# Patient Record
Sex: Female | Born: 1955 | Race: Black or African American | Hispanic: No | Marital: Married | Smoking: Never smoker
Health system: Southern US, Community
[De-identification: ages and names within clinical notes are randomized; demographics above are authoritative.]

## PROBLEM LIST (undated history)

## (undated) DIAGNOSIS — I10 Essential (primary) hypertension: Secondary | ICD-10-CM

## (undated) DIAGNOSIS — E785 Hyperlipidemia, unspecified: Secondary | ICD-10-CM

## (undated) HISTORY — PX: TUBAL LIGATION: SHX77

## (undated) HISTORY — DX: Hyperlipidemia, unspecified: E78.5

---

## 2007-10-21 HISTORY — PX: COLONOSCOPY: SHX174

## 2010-12-14 ENCOUNTER — Ambulatory Visit: Payer: Self-pay | Admitting: Internal Medicine

## 2012-02-16 ENCOUNTER — Ambulatory Visit: Payer: Self-pay | Admitting: Internal Medicine

## 2013-03-06 ENCOUNTER — Ambulatory Visit: Payer: Self-pay | Admitting: Internal Medicine

## 2014-03-17 ENCOUNTER — Ambulatory Visit: Payer: Self-pay | Admitting: Internal Medicine

## 2015-03-07 ENCOUNTER — Encounter: Payer: Self-pay | Admitting: Internal Medicine

## 2015-03-07 DIAGNOSIS — M19079 Primary osteoarthritis, unspecified ankle and foot: Secondary | ICD-10-CM | POA: Insufficient documentation

## 2015-03-07 DIAGNOSIS — E785 Hyperlipidemia, unspecified: Secondary | ICD-10-CM | POA: Insufficient documentation

## 2015-03-07 DIAGNOSIS — R600 Localized edema: Secondary | ICD-10-CM | POA: Insufficient documentation

## 2015-04-21 ENCOUNTER — Ambulatory Visit (INDEPENDENT_AMBULATORY_CARE_PROVIDER_SITE_OTHER): Payer: Managed Care, Other (non HMO) | Admitting: Internal Medicine

## 2015-04-21 ENCOUNTER — Encounter: Payer: Self-pay | Admitting: Internal Medicine

## 2015-04-21 ENCOUNTER — Ambulatory Visit
Admission: RE | Admit: 2015-04-21 | Discharge: 2015-04-21 | Disposition: A | Discharge status: Home / Self Care | Payer: Managed Care, Other (non HMO) | Source: Ambulatory Visit | Attending: Internal Medicine | Admitting: Internal Medicine

## 2015-04-21 VITALS — BP 130/88 | HR 64 | Ht 65.0 in | Wt 196.0 lb

## 2015-04-21 DIAGNOSIS — Z114 Encounter for screening for human immunodeficiency virus [HIV]: Secondary | ICD-10-CM

## 2015-04-21 DIAGNOSIS — R600 Localized edema: Secondary | ICD-10-CM | POA: Diagnosis not present

## 2015-04-21 DIAGNOSIS — Z1239 Encounter for other screening for malignant neoplasm of breast: Secondary | ICD-10-CM | POA: Diagnosis not present

## 2015-04-21 DIAGNOSIS — E785 Hyperlipidemia, unspecified: Secondary | ICD-10-CM | POA: Diagnosis not present

## 2015-04-21 DIAGNOSIS — R030 Elevated blood-pressure reading, without diagnosis of hypertension: Secondary | ICD-10-CM | POA: Diagnosis not present

## 2015-04-21 DIAGNOSIS — Z0000 Encounter for general adult medical examination without abnormal findings: Secondary | ICD-10-CM | POA: Diagnosis not present

## 2015-04-21 DIAGNOSIS — Z1231 Encounter for screening mammogram for malignant neoplasm of breast: Secondary | ICD-10-CM | POA: Insufficient documentation

## 2015-04-21 DIAGNOSIS — Z23 Encounter for immunization: Secondary | ICD-10-CM

## 2015-04-21 DIAGNOSIS — Z1159 Encounter for screening for other viral diseases: Secondary | ICD-10-CM | POA: Diagnosis not present

## 2015-04-21 LAB — POCT URINALYSIS DIPSTICK
Bilirubin, UA: NEGATIVE
Blood, UA: NEGATIVE
Glucose, UA: NEGATIVE
Ketones, UA: NEGATIVE
Leukocytes, UA: NEGATIVE
Nitrite, UA: NEGATIVE
Protein, UA: NEGATIVE
Spec Grav, UA: 1.01
Urobilinogen, UA: 0.2
pH, UA: 5

## 2015-04-21 NOTE — Patient Instructions (Addendum)
DASH Eating Plan DASH stands for "Dietary Approaches to Stop Hypertension." The DASH eating plan is a healthy eating plan that has been shown to reduce high blood pressure (hypertension). Additional health benefits may include reducing the risk of type 2 diabetes mellitus, heart disease, and stroke. The DASH eating plan may also help with weight loss. WHAT DO I NEED TO KNOW ABOUT THE DASH EATING PLAN? For the DASH eating plan, you will follow these general guidelines:  Choose foods with a percent daily value for sodium of less than 5% (as listed on the food label).  Use salt-free seasonings or herbs instead of table salt or sea salt.  Check with your health care provider or pharmacist before using salt substitutes.  Eat lower-sodium products, often labeled as "lower sodium" or "no salt added."  Eat fresh foods.  Eat more vegetables, fruits, and low-fat dairy products.  Choose whole grains. Look for the word "whole" as the first word in the ingredient list.  Choose fish and skinless chicken or [place] more often than red meat. Limit fish, poultry, and meat to 6 oz (170 g) each day.  Limit sweets, desserts, sugars, and sugary drinks.  Choose heart-healthy fats.  Limit cheese to 1 oz (28 g) per day.  Eat more home-cooked food and less restaurant, buffet, and fast food.  Limit fried foods.  Cook foods using methods other than frying.  Limit canned vegetables. If you do use them, rinse them well to decrease the sodium.  When eating at a restaurant, ask that your food be prepared with less salt, or no salt if possible. WHAT FOODS CAN I EAT? Seek help from a dietitian for individual calorie needs. Grains Whole grain or whole wheat bread. Brown rice. Whole grain or whole wheat pasta. Quinoa, bulgur, and whole grain cereals. Low-sodium cereals. Corn or whole wheat flour tortillas. Whole grain cornbread. Whole grain crackers. Low-sodium crackers. Vegetables Fresh or frozen vegetables  (raw, steamed, roasted, or grilled). Low-sodium or reduced-sodium tomato and vegetable juices. Low-sodium or reduced-sodium tomato sauce and paste. Low-sodium or reduced-sodium canned vegetables.  Fruits All fresh, canned (in natural juice), or frozen fruits. Meat and Other Protein Products Ground beef (85% or leaner), grass-fed beef, or beef trimmed of fat. Skinless chicken or [place]. Ground chicken or [place]. Pork trimmed of fat. All fish and seafood. Eggs. Dried beans, peas, or lentils. Unsalted nuts and seeds. Unsalted canned beans. Dairy Low-fat dairy products, such as skim or 1% milk, 2% or reduced-fat cheeses, low-fat ricotta or cottage cheese, or plain low-fat yogurt. Low-sodium or reduced-sodium cheeses. Fats and Oils Tub margarines without trans fats. Light or reduced-fat mayonnaise and salad dressings (reduced sodium). Avocado. Safflower, olive, or canola oils. Natural peanut or almond butter. Other Unsalted popcorn and pretzels. The items listed above may not be a complete list of recommended foods or beverages. Contact your dietitian for more options. WHAT FOODS ARE NOT RECOMMENDED? Grains White bread. White pasta. White rice. Refined cornbread. Bagels and croissants. Crackers that contain trans fat. Vegetables Creamed or fried vegetables. Vegetables in a cheese sauce. Regular canned vegetables. Regular canned tomato sauce and paste. Regular tomato and vegetable juices. Fruits Dried fruits. Canned fruit in light or heavy syrup. Fruit juice. Meat and Other Protein Products Fatty cuts of meat. Ribs, chicken wings, bacon, sausage, bologna, salami, chitterlings, fatback, hot dogs, bratwurst, and packaged luncheon meats. Salted nuts and seeds. Canned beans with salt. Dairy Whole or 2% milk, cream, half-and-half, and cream cheese. Whole-fat or sweetened yogurt. Full-fat  cheeses or blue cheese. Nondairy creamers and whipped toppings. Processed cheese, cheese spreads, or cheese  curds. Condiments Onion and garlic salt, seasoned salt, table salt, and sea salt. Canned and packaged gravies. Worcestershire sauce. Tartar sauce. Barbecue sauce. Teriyaki sauce. Soy sauce, including reduced sodium. Steak sauce. Fish sauce. Oyster sauce. Cocktail sauce. Horseradish. Ketchup and mustard. Meat flavorings and tenderizers. Bouillon cubes. Hot sauce. Tabasco sauce. Marinades. Taco seasonings. Relishes. Fats and Oils Butter, stick margarine, lard, shortening, ghee, and bacon fat. Coconut, palm kernel, or palm oils. Regular salad dressings. Other Pickles and olives. Salted popcorn and pretzels. The items listed above may not be a complete list of foods and beverages to avoid. Contact your dietitian for more information. WHERE CAN I FIND MORE INFORMATION? National Heart, Lung, and Blood Institute: CableProm[employer].itwww.nhlbi.nih.gov/health/health-topics/topics/dash/   This information is not intended to replace advice given to you by your health care provider. Make sure you discuss any questions you have with your health care provider.   Document Released: 04/27/2011 Document Revised: 05/29/2014 Document Reviewed: 03/12/2013 Elsevier Interactive Patient Education 2016 Elsevier Inc.      Breast Self-Awareness Practicing breast self-awareness may pick up problems early, prevent significant medical complications, and possibly save your life. By practicing breast self-awareness, you can become familiar with how your breasts look and feel and if your breasts are changing. This allows you to notice changes early. It can also offer you some reassurance that your breast health is good. One way to learn what is normal for your breasts and whether your breasts are changing is to do a breast self-exam. If you find a lump or something that was not present in the past, it is best to contact your caregiver right away. Other findings that should be evaluated by your caregiver include nipple discharge, especially if  it is bloody; skin changes or reddening; areas where the skin seems to be pulled in (retracted); or new lumps and bumps. Breast pain is seldom associated with cancer (malignancy), but should also be evaluated by a caregiver. HOW TO PERFORM A BREAST SELF-EXAM The best time to examine your breasts is 5-7 days after your menstrual period is over. During menstruation, the breasts are lumpier, and it may be more difficult to pick up changes. If you do not menstruate, have reached menopause, or had your uterus removed (hysterectomy), you should examine your breasts at regular intervals, such as monthly. If you are breastfeeding, examine your breasts after a feeding or after using a breast pump. Breast implants do not decrease the risk for lumps or tumors, so continue to perform breast self-exams as recommended. Talk to your caregiver about how to determine the difference between the implant and breast tissue. Also, talk about the amount of pressure you should use during the exam. Over time, you will become more familiar with the variations of your breasts and more comfortable with the exam. A breast self-exam requires you to remove all your clothes above the waist.  Look at your breasts and nipples. Stand in front of a mirror in a room with good lighting. With your hands on your hips, push your hands firmly downward. Look for a difference in shape, contour, and size from one breast to the other (asymmetry). Asymmetry includes puckers, dips, or bumps. Also, look for skin changes, such as reddened or scaly areas on the breasts. Look for nipple changes, such as discharge, dimpling, repositioning, or redness.  Carefully feel your breasts. This is best done either in the shower or tub while  using soapy water or when flat on your back. Place the arm (on the side of the breast you are examining) above your head. Use the pads (not the fingertips) of your three middle fingers on your opposite hand to feel your breasts.  Start in the underarm area and use  inch (2 cm) overlapping circles to feel your breast. Use 3 different levels of pressure (light, medium, and firm pressure) at each circle before moving to the next circle. The light pressure is needed to feel the tissue closest to the skin. The medium pressure will help to feel breast tissue a little deeper, while the firm pressure is needed to feel the tissue close to the ribs. Continue the overlapping circles, moving downward over the breast until you feel your ribs below your breast. Then, move one finger-width towards the center of the body. Continue to use the  inch (2 cm) overlapping circles to feel your breast as you move slowly up toward the collar bone (clavicle) near the base of the neck. Continue the up and down exam using all 3 pressures until you reach the middle of the chest. Do this with each breast, carefully feeling for lumps or changes.   Keep a written record with breast changes or normal findings for each breast. By writing this information down, you do not need to depend only on memory for size, tenderness, or location. Write down where you are in your menstrual cycle, if you are still menstruating. Breast tissue can have some lumps or thick tissue. However, see your caregiver if you find anything that concerns you.  SEEK MEDICAL CARE IF:  You see a change in shape, contour, or size of your breasts or nipples.   You see skin changes, such as reddened or scaly areas on the breasts or nipples.   You have an unusual discharge from your nipples.   You feel a new lump or unusually thick areas.    This information is not intended to replace advice given to you by your health care provider. Make sure you discuss any questions you have with your health care provider.   Document Released: 05/08/2005 Document Revised: 04/24/2012 Document Reviewed: 08/23/2011 Elsevier Interactive Patient Education [employer]

## 2015-04-21 NOTE — Progress Notes (Signed)
Date:  04/21/2015   Name:  Feliz BeamCathy Alston Laverdiere   DOB:  10/06/1955   MRN:  [id number]   Chief Complaint: Annual Exam Lynden Angathy Scot Docklston Mantel is a 59 y.o. female who presents today for her Complete Annual Exam. She feels well. She reports exercising none but plans to start soon. She reports she is sleeping well. She denies breast problems but is due for a mammogram.  Her last pap smear was 2014. She would like to get a flu shot today.  Review of Systems  Constitutional: Negative for fever, chills and fatigue.  HENT: Negative for ear pain, hearing loss, sinus pressure and sore throat.   Eyes: Negative for visual disturbance.  Respiratory: Negative for choking, shortness of breath and wheezing.   Cardiovascular: Negative for chest pain, palpitations and leg swelling.  Gastrointestinal: Negative for vomiting, abdominal pain, diarrhea and constipation.  Genitourinary: Negative for dysuria, urgency, hematuria, vaginal bleeding and vaginal discharge.  Musculoskeletal: Negative for back pain, joint swelling and arthralgias.  Skin: Negative for color change and rash.  Neurological: Negative for dizziness, tremors, light-headedness and headaches.  Hematological: Negative for adenopathy.  Psychiatric/Behavioral: Negative for sleep disturbance and dysphoric mood.    Patient Active Problem List   Diagnosis Date Noted  . Hyperlipidemia 03/07/2015  . Ankle inflammation 03/07/2015  . Local edema 03/07/2015    Prior to Admission medications   Not on File    No Known Allergies  Past Surgical History  Procedure Laterality Date  . Tubal ligation    . Colonoscopy      normal    Social History  Substance Use Topics  . Smoking status: Never Smoker   . Smokeless tobacco: None  . Alcohol Use: 1.2 oz/week    2 Standard drinks or equivalent per week    Medication list has been reviewed and updated.   Physical Exam  Constitutional: She is oriented to person, place, and time. She appears  well-developed and well-nourished. No distress.  HENT:  Head: Normocephalic and atraumatic.  Right Ear: Tympanic membrane and ear canal normal.  Left Ear: Tympanic membrane and ear canal normal.  Nose: Right sinus exhibits no maxillary sinus tenderness. Left sinus exhibits no maxillary sinus tenderness.  Mouth/Throat: Uvula is midline and oropharynx is clear and moist.  Eyes: Conjunctivae and EOM are normal. Right eye exhibits no discharge. Left eye exhibits no discharge. No scleral icterus.  Neck: Normal range of motion. Carotid bruit is not present. No erythema present. No thyromegaly present.  Cardiovascular: Normal rate, regular rhythm, normal heart sounds and normal pulses.   Pulmonary/Chest: Effort normal. No respiratory distress. She has no wheezes. Right breast exhibits no mass, no nipple discharge, no skin change and no tenderness. Left breast exhibits no mass, no nipple discharge, no skin change and no tenderness.  Abdominal: Soft. Bowel sounds are normal. There is no hepatosplenomegaly. There is no tenderness. There is no CVA tenderness.  Musculoskeletal: Normal range of motion.  Lymphadenopathy:    She has no cervical adenopathy.    She has no axillary adenopathy.  Neurological: She is alert and oriented to person, place, and time. She has normal reflexes. No cranial nerve deficit or sensory deficit.  Skin: Skin is warm, dry and intact. No rash noted.  Psychiatric: She has a normal mood and affect. Her speech is normal and behavior is normal. Thought content normal.  Nursing note and vitals reviewed.   BP 130/88 mmHg  Pulse 64  Ht 5\' 5"  (1.651 m)  Wt 196  lb (88.905 kg)  BMI 32.62 kg/m2  Assessment and Plan: 1. Annual physical exam Pap and colonoscopy are up to date. - POCT urinalysis dipstick  2. Hyperlipidemia Advise on medication when labs return - Lipid panel  3. Local edema Controlled - continue to limit sodium - CBC with Differential/Platelet - Comprehensive  metabolic panel - TSH  4. Breast cancer screening - MM DIGITAL SCREENING BILATERAL; Future  5. Need for hepatitis C screening test - Hepatitis C antibody  6. Encounter for screening for HIV - HIV antibody  7. Need for influenza vaccination - Flu Vaccine QUAD 36+ mos IM  8. Elevated blood pressure reading DASH diet information given Patient will monitor blood pressures through work and return if persistently elevated   Bari Edward, MD [facility]  04/21/2015

## 2015-04-22 LAB — CBC WITH DIFFERENTIAL/PLATELET
Basophils Absolute: 0 10*3/uL (ref 0.0–0.2)
Basos: 0 %
EOS (ABSOLUTE): 0.1 10*3/uL (ref 0.0–0.4)
Eos: 2 %
Hematocrit: 40.4 % (ref 34.0–46.6)
Hemoglobin: 13 g/dL (ref 11.1–15.9)
Immature Grans (Abs): 0 10*3/uL (ref 0.0–0.1)
Immature Granulocytes: 0 %
Lymphocytes Absolute: 2.4 10*3/uL (ref 0.7–3.1)
Lymphs: 51 %
MCH: 27.3 pg (ref 26.6–33.0)
MCHC: 32.2 g/dL (ref 31.5–35.7)
MCV: 85 fL (ref 79–97)
Monocytes Absolute: 0.3 10*3/uL (ref 0.1–0.9)
Monocytes: 6 %
Neutrophils Absolute: 1.9 10*3/uL (ref 1.4–7.0)
Neutrophils: 41 %
Platelets: 270 10*3/uL (ref 150–379)
RBC: 4.77 x10E6/uL (ref 3.77–5.28)
RDW: 14.3 % (ref 12.3–15.4)
WBC: 4.8 10*3/uL (ref 3.4–10.8)

## 2015-04-22 LAB — LIPID PANEL
Chol/HDL Ratio: 4.1 ratio units (ref 0.0–4.4)
Cholesterol, Total: 233 mg/dL — ABNORMAL HIGH (ref 100–199)
HDL: 57 mg/dL (ref >39)
LDL Calculated: 156 mg/dL — ABNORMAL HIGH (ref 0–99)
Triglycerides: 100 mg/dL (ref 0–149)
VLDL Cholesterol Cal: 20 mg/dL (ref 5–40)

## 2015-04-22 LAB — COMPREHENSIVE METABOLIC PANEL
ALT: 13 IU/L (ref 0–32)
AST: 15 IU/L (ref 0–40)
Albumin/Globulin Ratio: 1.5 (ref 1.1–2.5)
Albumin: 4.1 g/dL (ref 3.5–5.5)
Alkaline Phosphatase: 84 IU/L (ref 39–117)
BUN/Creatinine Ratio: 15 (ref 9–23)
BUN: 12 mg/dL (ref 6–24)
Bilirubin Total: 0.3 mg/dL (ref 0.0–1.2)
CO2: 28 mmol/L (ref 18–29)
Calcium: 9.3 mg/dL (ref 8.7–10.2)
Chloride: 102 mmol/L (ref 97–106)
Creatinine, Ser: 0.79 mg/dL (ref 0.57–1.00)
GFR calc Af Amer: 95 mL/min/{1.73_m2} (ref >59)
GFR calc non Af Amer: 82 mL/min/{1.73_m2} (ref >59)
Globulin, Total: 2.7 g/dL (ref 1.5–4.5)
Glucose: 80 mg/dL (ref 65–99)
Potassium: 4.3 mmol/L (ref 3.5–5.2)
Sodium: 143 mmol/L (ref 136–144)
Total Protein: 6.8 g/dL (ref 6.0–8.5)

## 2015-04-22 LAB — HIV ANTIBODY (ROUTINE TESTING W REFLEX): HIV Screen 4th Generation wRfx: NONREACTIVE

## 2015-04-22 LAB — TSH: TSH: 2 u[IU]/mL (ref 0.450–4.500)

## 2015-04-22 LAB — HEPATITIS C ANTIBODY: Hep C Virus Ab: 0.1 s/co ratio (ref 0.0–0.9)

## 2016-03-13 ENCOUNTER — Other Ambulatory Visit: Payer: Self-pay | Admitting: Internal Medicine

## 2016-03-13 DIAGNOSIS — Z1231 Encounter for screening mammogram for malignant neoplasm of breast: Secondary | ICD-10-CM

## 2016-04-24 ENCOUNTER — Ambulatory Visit
Admission: RE | Admit: 2016-04-24 | Discharge: 2016-04-24 | Disposition: A | Discharge status: Home / Self Care | Payer: Managed Care, Other (non HMO) | Source: Ambulatory Visit | Attending: Internal Medicine | Admitting: Internal Medicine

## 2016-04-24 DIAGNOSIS — Z1231 Encounter for screening mammogram for malignant neoplasm of breast: Secondary | ICD-10-CM | POA: Diagnosis not present

## 2016-04-28 ENCOUNTER — Encounter: Payer: Self-pay | Admitting: Internal Medicine

## 2016-04-28 ENCOUNTER — Ambulatory Visit (INDEPENDENT_AMBULATORY_CARE_PROVIDER_SITE_OTHER): Payer: Managed Care, Other (non HMO) | Admitting: Internal Medicine

## 2016-04-28 VITALS — BP 138/80 | HR 78 | Resp 16 | Ht 65.0 in | Wt 191.3 lb

## 2016-04-28 DIAGNOSIS — Z0000 Encounter for general adult medical examination without abnormal findings: Secondary | ICD-10-CM | POA: Diagnosis not present

## 2016-04-28 DIAGNOSIS — R600 Localized edema: Secondary | ICD-10-CM

## 2016-04-28 DIAGNOSIS — Z23 Encounter for immunization: Secondary | ICD-10-CM | POA: Insufficient documentation

## 2016-04-28 DIAGNOSIS — E782 Mixed hyperlipidemia: Secondary | ICD-10-CM | POA: Diagnosis not present

## 2016-04-28 LAB — POCT URINALYSIS DIPSTICK
Bilirubin, UA: NEGATIVE
Blood, UA: NEGATIVE
Glucose, UA: NEGATIVE
Ketones, UA: NEGATIVE
Leukocytes, UA: NEGATIVE
Nitrite, UA: NEGATIVE
Protein, UA: NEGATIVE
Spec Grav, UA: >=1.03
Urobilinogen, UA: 0.2
pH, UA: 6

## 2016-04-28 NOTE — Progress Notes (Signed)
Date:  04/28/2016   Name:  Courtney Taylor   DOB:  08/23/1955   MRN:  [id number]   Chief Complaint: Annual Exam (PAP) Courtney Taylor is a 60 y.o. female who presents today for her Complete Annual Exam. She feels well. She reports exercising more regularly. She reports she is sleeping well. She had a mammogram recently that was normal. She is due for a Pap smear.  Last done in 2014. 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  Review of Systems  Constitutional: Negative for chills, fatigue and fever.  HENT: Negative for congestion, hearing loss, tinnitus, trouble swallowing and voice change.   Eyes: Negative for visual disturbance.  Respiratory: Negative for cough, chest tightness, shortness of breath and wheezing.   Cardiovascular: Negative for chest pain, palpitations and leg swelling.  Gastrointestinal: Negative for abdominal pain, constipation, diarrhea and vomiting.  Endocrine: Negative for polydipsia and polyuria.  Genitourinary: Negative for dysuria, frequency, genital sores, vaginal bleeding and vaginal discharge.  Musculoskeletal: Negative for arthralgias, gait problem and joint swelling.  Skin: Negative for color change and rash.  Neurological: Negative for dizziness, tremors, light-headedness and headaches.  Hematological: Negative for adenopathy. Does not bruise/bleed easily.  Psychiatric/Behavioral: Negative for dysphoric mood and sleep disturbance. The patient is not nervous/anxious.     Patient Active Problem List   Diagnosis Date Noted  . Need for diphtheria-tetanus-pertussis (Tdap) vaccine 04/28/2016  . Elevated blood pressure reading 04/21/2015  . Hyperlipidemia 03/07/2015  . Ankle inflammation 03/07/2015  . Local edema 03/07/2015    Prior to Admission medications   Medication Sig Start Date End Date Taking? Authorizing Provider  CINNAMON PO Take by mouth.   Yes Historical Provider, MD  GARLIC PO Take by mouth.   Yes Historical  Provider, MD    No Known Allergies  Past Surgical History:  Procedure Laterality Date  . COLONOSCOPY     normal  . TUBAL LIGATION      Social History  Substance Use Topics  . Smoking status: Never Smoker  . Smokeless tobacco: Never Used  . Alcohol use 1.2 oz/week    2 Standard drinks or equivalent per week   BP Readings from Last 3 Encounters:  04/28/16 138/80  04/21/15 130/88     Medication list has been reviewed and updated.   Physical Exam  Constitutional: She is oriented to person, place, and time. She appears well-developed and well-nourished. No distress.  HENT:  Head: Normocephalic and atraumatic.  Right Ear: Tympanic membrane and ear canal normal.  Left Ear: Tympanic membrane and ear canal normal.  Nose: Right sinus exhibits no maxillary sinus tenderness. Left sinus exhibits no maxillary sinus tenderness.  Mouth/Throat: Uvula is midline and oropharynx is clear and moist.  Eyes: Conjunctivae and EOM are normal. Right eye exhibits no discharge. Left eye exhibits no discharge. No scleral icterus.  Neck: Normal range of motion. Carotid bruit is not present. No erythema present. No thyromegaly present.  Cardiovascular: Normal rate, regular rhythm, normal heart sounds and normal pulses.   Pulmonary/Chest: Effort normal. No respiratory distress. She has no wheezes. Right breast exhibits no mass, no nipple discharge, no skin change and no tenderness. Left breast exhibits no mass, no nipple discharge, no skin change and no tenderness.  Abdominal: Soft. Bowel sounds are normal. There is no hepatosplenomegaly. There is no tenderness. There is no CVA tenderness.  Genitourinary: Vagina normal and uterus normal. There is no tenderness, lesion or injury on the right  labia. There is no tenderness, lesion or injury on the left labia. Cervix exhibits no motion tenderness. Right adnexum displays no mass, no tenderness and no fullness. Left adnexum displays no mass, no tenderness and no  fullness.  Musculoskeletal: Normal range of motion.  Lymphadenopathy:    She has no cervical adenopathy.    She has no axillary adenopathy.  Neurological: She is alert and oriented to person, place, and time. She has normal reflexes. No cranial nerve deficit or sensory deficit.  Skin: Skin is warm, dry and intact. No rash noted.  Psychiatric: She has a normal mood and affect. Her speech is normal and behavior is normal. Thought content normal.  Nursing note and vitals reviewed.   BP 138/80   Pulse 78   Resp 16   Ht 5\' 5"  (1.651 m)   Wt 191 lb 4.8 oz (86.8 kg)   SpO2 99%   BMI 31.83 kg/m   Assessment and Plan: 1. Annual physical exam Continue exercise and annual mammograms - CBC with Differential/Platelet - Pap IG and HPV (high risk) DNA detection - POCT urinalysis dipstick  2. Mixed hyperlipidemia Continue diet - Lipid panel  3. Local edema controlled - Comprehensive metabolic panel - TSH  4. Need for diphtheria-tetanus-pertussis (Tdap) vaccine - Tdap vaccine greater than or equal to 7yo IM   Bari EdwardLaura Elyas Villamor, MD [facility]  04/28/2016

## 2016-04-28 NOTE — Patient Instructions (Signed)
DASH Eating Plan DASH stands for "Dietary Approaches to Stop Hypertension." The DASH eating plan is a healthy eating plan that has been shown to reduce high blood pressure (hypertension). Additional health benefits may include reducing the risk of type 2 diabetes mellitus, heart disease, and stroke. The DASH eating plan may also help with weight loss. What do I need to know about the DASH eating plan? For the DASH eating plan, you will follow these general guidelines:  Choose foods with less than 150 milligrams of sodium per serving (as listed on the food label).  Use salt-free seasonings or herbs instead of table salt or sea salt.  Check with your health care provider or pharmacist before using salt substitutes.  Eat lower-sodium products. These are often labeled as "low-sodium" or "no salt added."  Eat fresh foods. Avoid eating a lot of canned foods.  Eat more vegetables, fruits, and low-fat dairy products.  Choose whole grains. Look for the word "whole" as the first word in the ingredient list.  Choose fish and skinless chicken or turkey more often than red meat. Limit fish, poultry, and meat to 6 oz (170 g) each day.  Limit sweets, desserts, sugars, and sugary drinks.  Choose heart-healthy fats.  Eat more home-cooked food and less restaurant, buffet, and fast food.  Limit fried foods.  Do not fry foods. Cook foods using methods such as baking, boiling, grilling, and broiling instead.  When eating at a restaurant, ask that your food be prepared with less salt, or no salt if possible. What foods can I eat? Seek help from a dietitian for individual calorie needs. Grains  Whole grain or whole wheat bread. Brown rice. Whole grain or whole wheat pasta. Quinoa, bulgur, and whole grain cereals. Low-sodium cereals. Corn or whole wheat flour tortillas. Whole grain cornbread. Whole grain crackers. Low-sodium crackers. Vegetables  Fresh or frozen vegetables (raw, steamed, roasted, or  grilled). Low-sodium or reduced-sodium tomato and vegetable juices. Low-sodium or reduced-sodium tomato sauce and paste. Low-sodium or reduced-sodium canned vegetables. Fruits  All fresh, canned (in natural juice), or frozen fruits. Meat and Other Protein Products  Ground beef (85% or leaner), grass-fed beef, or beef trimmed of fat. Skinless chicken or turkey. Ground chicken or turkey. Pork trimmed of fat. All fish and seafood. Eggs. Dried beans, peas, or lentils. Unsalted nuts and seeds. Unsalted canned beans. Dairy  Low-fat dairy products, such as skim or 1% milk, 2% or reduced-fat cheeses, low-fat ricotta or cottage cheese, or plain low-fat yogurt. Low-sodium or reduced-sodium cheeses. Fats and Oils  Tub margarines without trans fats. Light or reduced-fat mayonnaise and salad dressings (reduced sodium). Avocado. Safflower, olive, or canola oils. Natural peanut or almond butter. Other  Unsalted popcorn and pretzels. The items listed above may not be a complete list of recommended foods or beverages. Contact your dietitian for more options.  What foods are not recommended? Grains  White bread. White pasta. White rice. Refined cornbread. Bagels and croissants. Crackers that contain trans fat. Vegetables  Creamed or fried vegetables. Vegetables in a cheese sauce. Regular canned vegetables. Regular canned tomato sauce and paste. Regular tomato and vegetable juices. Fruits  Canned fruit in light or heavy syrup. Fruit juice. Meat and Other Protein Products  Fatty cuts of meat. Ribs, chicken wings, bacon, sausage, bologna, salami, chitterlings, fatback, hot dogs, bratwurst, and packaged luncheon meats. Salted nuts and seeds. Canned beans with salt. Dairy  Whole or 2% milk, cream, half-and-half, and cream cheese. Whole-fat or sweetened yogurt. Full-fat cheeses   or blue cheese. Nondairy creamers and whipped toppings. Processed cheese, cheese spreads, or cheese curds. Condiments  Onion and garlic  salt, seasoned salt, table salt, and sea salt. Canned and packaged gravies. Worcestershire sauce. Tartar sauce. Barbecue sauce. Teriyaki sauce. Soy sauce, including reduced sodium. Steak sauce. Fish sauce. Oyster sauce. Cocktail sauce. Horseradish. Ketchup and mustard. Meat flavorings and tenderizers. Bouillon cubes. Hot sauce. Tabasco sauce. Marinades. Taco seasonings. Relishes. Fats and Oils  Butter, stick margarine, lard, shortening, ghee, and bacon fat. Coconut, palm kernel, or palm oils. Regular salad dressings. Other  Pickles and olives. Salted popcorn and pretzels. The items listed above may not be a complete list of foods and beverages to avoid. Contact your dietitian for more information.  Where can I find more information? National Heart, Lung, and Blood Institute: www.nhlbi.nih.gov/health/health-topics/topics/dash/ This information is not intended to replace advice given to you by your health care provider. Make sure you discuss any questions you have with your health care provider. Document Released: 04/27/2011 Document Revised: 10/14/2015 Document Reviewed: 03/12/2013 Elsevier Interactive Patient Education  2017 Elsevier Inc.  

## 2016-04-29 LAB — CBC WITH DIFFERENTIAL/PLATELET
Basophils Absolute: 0 10*3/uL (ref 0.0–0.2)
Basos: 0 %
EOS (ABSOLUTE): 0.1 10*3/uL (ref 0.0–0.4)
Eos: 3 %
Hematocrit: 38.4 % (ref 34.0–46.6)
Hemoglobin: 12.3 g/dL (ref 11.1–15.9)
Immature Grans (Abs): 0 10*3/uL (ref 0.0–0.1)
Immature Granulocytes: 0 %
Lymphocytes Absolute: 2.1 10*3/uL (ref 0.7–3.1)
Lymphs: 44 %
MCH: 26.5 pg — ABNORMAL LOW (ref 26.6–33.0)
MCHC: 32 g/dL (ref 31.5–35.7)
MCV: 83 fL (ref 79–97)
Monocytes Absolute: 0.4 10*3/uL (ref 0.1–0.9)
Monocytes: 9 %
Neutrophils Absolute: 2.1 10*3/uL (ref 1.4–7.0)
Neutrophils: 44 %
Platelets: 266 10*3/uL (ref 150–379)
RBC: 4.64 x10E6/uL (ref 3.77–5.28)
RDW: 14.7 % (ref 12.3–15.4)
WBC: 4.8 10*3/uL (ref 3.4–10.8)

## 2016-04-29 LAB — COMPREHENSIVE METABOLIC PANEL
ALT: 25 IU/L (ref 0–32)
AST: 19 IU/L (ref 0–40)
Albumin/Globulin Ratio: 1.5 (ref 1.2–2.2)
Albumin: 4 g/dL (ref 3.6–4.8)
Alkaline Phosphatase: 105 IU/L (ref 39–117)
BUN/Creatinine Ratio: 15 (ref 12–28)
BUN: 14 mg/dL (ref 8–27)
Bilirubin Total: 0.3 mg/dL (ref 0.0–1.2)
CO2: 26 mmol/L (ref 18–29)
Calcium: 9.3 mg/dL (ref 8.7–10.3)
Chloride: 102 mmol/L (ref 96–106)
Creatinine, Ser: 0.93 mg/dL (ref 0.57–1.00)
GFR calc Af Amer: 77 mL/min/{1.73_m2} (ref >59)
GFR calc non Af Amer: 67 mL/min/{1.73_m2} (ref >59)
Globulin, Total: 2.6 g/dL (ref 1.5–4.5)
Glucose: 83 mg/dL (ref 65–99)
Potassium: 4.4 mmol/L (ref 3.5–5.2)
Sodium: 142 mmol/L (ref 134–144)
Total Protein: 6.6 g/dL (ref 6.0–8.5)

## 2016-04-29 LAB — LIPID PANEL
Chol/HDL Ratio: 4.8 ratio units — ABNORMAL HIGH (ref 0.0–4.4)
Cholesterol, Total: 213 mg/dL — ABNORMAL HIGH (ref 100–199)
HDL: 44 mg/dL (ref >39)
LDL Calculated: 148 mg/dL — ABNORMAL HIGH (ref 0–99)
Triglycerides: 105 mg/dL (ref 0–149)
VLDL Cholesterol Cal: 21 mg/dL (ref 5–40)

## 2016-04-29 LAB — TSH: TSH: 1.9 u[IU]/mL (ref 0.450–4.500)

## 2016-05-03 LAB — PAP IG AND HPV HIGH-RISK
HPV, high-risk: POSITIVE — AB
PAP Smear Comment: 0

## 2016-06-13 ENCOUNTER — Encounter: Payer: Self-pay | Admitting: Internal Medicine

## 2016-06-13 ENCOUNTER — Ambulatory Visit (INDEPENDENT_AMBULATORY_CARE_PROVIDER_SITE_OTHER): Payer: Managed Care, Other (non HMO) | Admitting: Internal Medicine

## 2016-06-13 VITALS — BP 126/86 | HR 92 | Temp 98.6°F | Ht 64.5 in | Wt 194.0 lb

## 2016-06-13 DIAGNOSIS — J0100 Acute maxillary sinusitis, unspecified: Secondary | ICD-10-CM | POA: Diagnosis not present

## 2016-06-13 MED ORDER — AMOXICILLIN-POT CLAVULANATE 875-125 MG PO TABS: 1.0000 | ORAL_TABLET | Freq: Two times a day (BID) | ORAL | 0 refills | Status: DC

## 2016-06-13 MED ORDER — GUAIFENESIN-CODEINE 100-10 MG/5ML PO SYRP: 5.0000 mL | ORAL_SOLUTION | Freq: Three times a day (TID) | ORAL | 0 refills | Status: DC | PRN

## 2016-06-13 MED ORDER — FLUTICASONE PROPIONATE 50 MCG/ACT NA SUSP: 2.0000 | Freq: Every day | NASAL | 6 refills | Status: DC

## 2016-06-13 NOTE — Progress Notes (Signed)
Date:  06/13/2016   Name:  Courtney Taylor   DOB:  [birth year]   MRN:  [id number]   Chief Complaint: Cough (pt stated couhing/drainage) Cough  The cough is productive of sputum. Associated symptoms include postnasal drip. Pertinent negatives include no chest pain, chills, ear pain, fever, rash, sore throat, shortness of breath or wheezing. The symptoms are aggravated by exercise. Treatments tried: claritin. The treatment provided no relief.      Review of Systems  Constitutional: Negative for chills, fatigue and fever.  HENT: Positive for congestion, postnasal drip and voice change. Negative for ear pain, sore throat and trouble swallowing.   Respiratory: Positive for cough and chest tightness. Negative for shortness of breath and wheezing.   Cardiovascular: Negative for chest pain, palpitations and leg swelling.  Skin: Negative for color change and rash.  Neurological: Negative for dizziness.    Patient Active Problem List   Diagnosis Date Noted  . Need for diphtheria-tetanus-pertussis (Tdap) vaccine 04/28/2016  . Elevated blood pressure reading 04/21/2015  . Hyperlipidemia 03/07/2015  . Ankle inflammation 03/07/2015  . Local edema 03/07/2015    Prior to Admission medications   Medication Sig Start Date End Date Taking? Authorizing Provider  CINNAMON PO Take by mouth.   Yes Historical Provider, MD  GARLIC PO Take by mouth.   Yes Historical Provider, MD    No Known Allergies  Past Surgical History:  Procedure Laterality Date  . COLONOSCOPY  10/21/2007   normal  . TUBAL LIGATION      Social History  Substance Use Topics  . Smoking status: Never Smoker  . Smokeless tobacco: Never Used  . Alcohol use 1.2 oz/week    2 Standard drinks or equivalent per week     Medication list has been reviewed and updated.   Physical Exam  Constitutional: She is oriented to person, place, and time. She appears well-developed and well-nourished. No distress.  HENT:  Head:  Normocephalic and atraumatic.  Right Ear: External ear and ear canal normal. Tympanic membrane is not erythematous and not retracted.  Left Ear: External ear and ear canal normal. Tympanic membrane is not erythematous and not retracted.  Nose: Right sinus exhibits maxillary sinus tenderness and frontal sinus tenderness. Left sinus exhibits maxillary sinus tenderness and frontal sinus tenderness.  Mouth/Throat: Uvula is midline and mucous membranes are normal. No oral lesions. Posterior oropharyngeal erythema present. No oropharyngeal exudate.  Cardiovascular: Normal rate, regular rhythm and normal heart sounds.   Pulmonary/Chest: Effort normal and breath sounds normal. No respiratory distress. She has no wheezes. She has no rales.  Musculoskeletal: Normal range of motion.  Lymphadenopathy:    She has no cervical adenopathy.  Neurological: She is alert and oriented to person, place, and time.  Skin: Skin is warm and dry. No rash noted.  Psychiatric: She has a normal mood and affect. Her behavior is normal. Thought content normal.  Nursing note and vitals reviewed.   BP 126/86   Pulse 92   Temp 98.6 F (37 C)   Ht 5' 4.5" (1.638 m)   Wt 194 lb (88 kg)   SpO2 95%   BMI 32.79 kg/m   Assessment and Plan: 1. Acute non-recurrent maxillary sinusitis Stop claritin - amoxicillin-clavulanate (AUGMENTIN) 875-125 MG tablet; Take 1 tablet by mouth 2 (two) times daily.  Dispense: 20 tablet; Refill: 0 - guaiFENesin-codeine (ROBITUSSIN AC) 100-10 MG/5ML syrup; Take 5 mLs by mouth 3 (three) times daily as needed for cough.  Dispense: 150 mL; Refill:  0 - fluticasone (FLONASE) 50 MCG/ACT nasal spray; Place 2 sprays into both nostrils daily.  Dispense: 16 g; Refill: 6   Bari EdwardLaura Allexis Bordenave, MD [facility]  06/13/2016

## 2016-06-22 ENCOUNTER — Other Ambulatory Visit: Payer: Self-pay | Admitting: Internal Medicine

## 2016-06-22 DIAGNOSIS — J0100 Acute maxillary sinusitis, unspecified: Secondary | ICD-10-CM

## 2016-06-30 ENCOUNTER — Telehealth: Payer: Self-pay

## 2016-06-30 ENCOUNTER — Other Ambulatory Visit: Payer: Self-pay | Admitting: Internal Medicine

## 2016-06-30 DIAGNOSIS — J0100 Acute maxillary sinusitis, unspecified: Secondary | ICD-10-CM

## 2016-06-30 MED ORDER — AMOXICILLIN-POT CLAVULANATE 875-125 MG PO TABS: 1.0000 | ORAL_TABLET | Freq: Two times a day (BID) | ORAL | 0 refills | Status: DC

## 2016-06-30 NOTE — Telephone Encounter (Signed)
Patient called requesting another round of abx. She reports that she has a productive cough with thick green mucus. Denies fever.  [facility] [place].

## 2016-07-27 ENCOUNTER — Telehealth: Payer: Self-pay

## 2016-07-27 NOTE — Telephone Encounter (Signed)
Pt called stating has sore throat X 2 days. Told to take OTC nasal spray or OTC allergy relief. Pt reports no fever. If not feeling better by Monday, I told her to call back to make appt to be seen.

## 2016-10-31 ENCOUNTER — Ambulatory Visit (INDEPENDENT_AMBULATORY_CARE_PROVIDER_SITE_OTHER): Payer: 59 | Admitting: Internal Medicine

## 2016-10-31 ENCOUNTER — Encounter: Payer: Self-pay | Admitting: Internal Medicine

## 2016-10-31 ENCOUNTER — Ambulatory Visit
Admission: RE | Admit: 2016-10-31 | Discharge: 2016-10-31 | Disposition: A | Discharge status: Home / Self Care | Payer: 59 | Source: Ambulatory Visit | Attending: Internal Medicine | Admitting: Internal Medicine

## 2016-10-31 VITALS — BP 122/74 | HR 95 | Temp 98.5°F | Wt 163.0 lb

## 2016-10-31 DIAGNOSIS — R05 Cough: Secondary | ICD-10-CM

## 2016-10-31 DIAGNOSIS — R059 Cough, unspecified: Secondary | ICD-10-CM

## 2016-10-31 NOTE — Patient Instructions (Addendum)
Advair - One inhalation twice a day.  Rinse mouth after use.

## 2016-10-31 NOTE — Progress Notes (Signed)
Date:  10/31/2016   Name:  Courtney Taylor   DOB:  Sep 16, 1955   MRN:  [id number]   Chief Complaint: Cough Micah Flesher away after visit in January - and then came back a few weeks later. Not getting any better. Clear to yellow production with cough. Throat feels like its draining.  ) Cough  This is a recurrent problem. The current episode started more than 1 month ago. The problem has been unchanged. The cough is productive of sputum. Associated symptoms include postnasal drip. Pertinent negatives include no chest pain, chills, fever, headaches, sore throat, shortness of breath or wheezing. Treatments tried: allegra, robitussin.  No new exposures, pets, work environment.  No benefit to cough from allegra and flonase.    Review of Systems  Constitutional: Negative for chills, fatigue and fever.  HENT: Positive for postnasal drip. Negative for congestion, sinus pressure and sore throat.   Eyes: Negative for visual disturbance.  Respiratory: Positive for cough and chest tightness. Negative for shortness of breath and wheezing.   Cardiovascular: Negative for chest pain and palpitations.  Gastrointestinal: Negative for diarrhea.  Neurological: Negative for dizziness and headaches.    Patient Active Problem List   Diagnosis Date Noted  . Need for diphtheria-tetanus-pertussis (Tdap) vaccine 04/28/2016  . Elevated blood pressure reading 04/21/2015  . Hyperlipidemia 03/07/2015  . Ankle inflammation 03/07/2015  . Local edema 03/07/2015    Prior to Admission medications   Medication Sig Start Date End Date Taking? Authorizing Provider  CINNAMON PO Take by mouth.   Yes [provider]  fluticasone (FLONASE) 50 MCG/ACT nasal spray Place 2 sprays into both nostrils daily. 06/13/16  Yes Reubin Milan, MD  GARLIC PO Take by mouth.   Yes [provider]    No Known Allergies  Past Surgical History:  Procedure Laterality Date  . COLONOSCOPY  10/21/2007   normal  . TUBAL  LIGATION      Social History  Substance Use Topics  . Smoking status: Never Smoker  . Smokeless tobacco: Never Used  . Alcohol use 1.2 oz/week    2 Standard drinks or equivalent per week     Medication list has been reviewed and updated.   Physical Exam  Constitutional: She is oriented to person, place, and time. She appears well-developed. No distress.  HENT:  Head: Normocephalic and atraumatic.  Nose: Right sinus exhibits no maxillary sinus tenderness and no frontal sinus tenderness. Left sinus exhibits no maxillary sinus tenderness and no frontal sinus tenderness.  Mouth/Throat: No posterior oropharyngeal edema or posterior oropharyngeal erythema.  Cardiovascular: Normal rate, regular rhythm and normal heart sounds.   Pulmonary/Chest: Effort normal and breath sounds normal. No respiratory distress. She has no wheezes. She has no rhonchi.  Frequent tight hard cough during visit   Musculoskeletal: Normal range of motion.  Neurological: She is alert and oriented to person, place, and time.  Skin: Skin is warm and dry. No rash noted.  Psychiatric: She has a normal mood and affect. Her behavior is normal. Thought content normal.  Nursing note and vitals reviewed.   BP 122/74   Pulse 95   Temp 98.5 F (36.9 C)   Wt 163 lb (73.9 kg)   SpO2 96%   BMI 27.55 kg/m   Assessment and Plan: 1. Cough Concern for new onset asthma vs other pulmonary process Begin Advair HFA 110  mcg - one inhalation twice a day - DG Chest 2 View; Future   No orders of the  defined types were placed in this encounter.   Bari EdwardLaura Franny Selvage, MD [facility]  10/31/2016

## 2016-11-02 ENCOUNTER — Telehealth: Payer: Self-pay | Admitting: Internal Medicine

## 2016-11-02 NOTE — Telephone Encounter (Signed)
Pt called today and mentioned she missed the call due to being at an appt she also said she had been trying to call back all day, she needs her test/lab results and some medication for her cough before she goes out of town. Please advise. DS

## 2016-11-03 ENCOUNTER — Telehealth: Payer: Self-pay

## 2016-11-03 NOTE — Telephone Encounter (Signed)
Tried calling pt but no answer. Left VM to explain to pt that Thursday has ALWAYS been Dr. Karn CassisBerglund's Half-Day so we have never been here on a Thursday afternoon. Waiting for pt call back.

## 2016-11-03 NOTE — Telephone Encounter (Signed)
Pt called on Thursday afternoon (11/02/2016) explaining she has a cough still, but phlegm is loosening up. Pt left FOUR Voicemail's on my phone. Unfortunately, Thursday is my half-day with Dr. Judithann GravesBerglund so pt seemed upset. Her last VM stated she "cannot believe we left her with this cough. I've called 3 other times and nobody is calling me back." Pt stated she is going out of town and needs a prescription for her cough before so. Per Toney Reilaisy (front desk) pt called again and found out that we are not in office. Pt told Daisy "I need to find me another doctor." I called pt this morning and no answer. Left Vm explaining that Thursday has ALWAYS been our half day. Also explained Dr. Judithann GravesBerglund cannot send anything in for her cough, but we will refer her to a pulmonologist instead. Told pt to callback and let us know if this is what she wants, and we will send referral. Awaiting pt call back with this information.

## 2016-11-06 ENCOUNTER — Other Ambulatory Visit: Payer: Self-pay | Admitting: Internal Medicine

## 2016-11-06 ENCOUNTER — Telehealth: Payer: Self-pay

## 2016-11-06 DIAGNOSIS — R053 Chronic cough: Secondary | ICD-10-CM | POA: Insufficient documentation

## 2016-11-06 DIAGNOSIS — R05 Cough: Secondary | ICD-10-CM

## 2016-11-06 MED ORDER — MONTELUKAST SODIUM 10 MG PO TABS: 10.0000 mg | ORAL_TABLET | Freq: Every day | ORAL | 0 refills | Status: DC

## 2016-11-06 NOTE — Telephone Encounter (Signed)
Rx sent in

## 2016-11-06 NOTE — Telephone Encounter (Signed)
Pt informed of Pulmon[facility] appt [facility]. Pt requesting that Sing[facility]air be sent in [facility] [place] to help with symptoms until Referral appt. Pt states inhaler only helps for a short time.

## 2017-02-05 ENCOUNTER — Telehealth: Payer: Self-pay

## 2017-02-05 NOTE — Telephone Encounter (Signed)
Patient called stating was seen in hospital for Bells Palsy and they are treating her for 7 days. Wanted to know if needs to follow up with Dr Judithann Graves.  Spoke with Dr Judithann Graves- no need to follow up unless symptoms worsen or get no better. Advised patient and she verbally understood.

## 2017-02-09 ENCOUNTER — Ambulatory Visit (INDEPENDENT_AMBULATORY_CARE_PROVIDER_SITE_OTHER): Payer: 59 | Admitting: Internal Medicine

## 2017-02-09 ENCOUNTER — Encounter: Payer: Self-pay | Admitting: Internal Medicine

## 2017-02-09 ENCOUNTER — Telehealth: Payer: Self-pay

## 2017-02-09 VITALS — BP 142/96 | HR 67 | Ht 64.5 in | Wt 152.0 lb

## 2017-02-09 DIAGNOSIS — G510 Bell's palsy: Secondary | ICD-10-CM | POA: Insufficient documentation

## 2017-02-09 MED ORDER — PREDNISONE 10 MG PO TABS: ORAL_TABLET | ORAL | 0 refills | Status: AC

## 2017-02-09 NOTE — Progress Notes (Signed)
Date:  02/09/2017   Name:  Courtney Taylor   DOB:  March 27, 1956   MRN:  [id number]   Chief Complaint: Bell's Palsy (Follow up- Was told by Dr at ER that if it was not better by 7 days, told to see PCP on 7th day for a few more days of both medications.)  Patient was seen in the ER one week ago with left-sided Bell's palsy. She was prescribed Valtrex and prednisone. She's here today because she was told if she wasn't better she should have additional therapy.  She actually is improved, she is able to close her eye completely with effort and she is able to make a seal with her lips. She can blow air into her cheeks with slight leakage. She has mild numbness under her left lower lip. Eyebrow raise on the left is reduced.  Review of Systems  Constitutional: Negative for chills, fatigue and fever.  Respiratory: Positive for cough and choking. Negative for chest tightness and shortness of breath.   Cardiovascular: Negative for chest pain, palpitations and leg swelling.  Neurological: Positive for weakness (of left face).    Patient Active Problem List   Diagnosis Date Noted  . Bell's palsy 02/09/2017  . Chronic cough 11/06/2016  . Need for diphtheria-tetanus-pertussis (Tdap) vaccine 04/28/2016  . Elevated blood pressure reading 04/21/2015  . Hyperlipidemia 03/07/2015  . Ankle inflammation 03/07/2015  . Local edema 03/07/2015    Prior to Admission medications   Not on File    No Known Allergies  Past Surgical History:  Procedure Laterality Date  . COLONOSCOPY  10/21/2007   normal  . TUBAL LIGATION      Social History  Substance Use Topics  . Smoking status: Never Smoker  . Smokeless tobacco: Never Used  . Alcohol use 1.2 oz/week    2 Standard drinks or equivalent per week     Medication list has been reviewed and updated.  PHQ 2/9 Scores 04/28/2016 04/21/2015  PHQ - 2 Score 0 0    Physical Exam  Constitutional: She is oriented to person, place, and time. She  appears well-developed. No distress.  HENT:  Head: Normocephalic and atraumatic.  Neck: Normal range of motion. Neck supple. No thyromegaly present.  Cardiovascular: Normal rate, regular rhythm and normal heart sounds.   Pulmonary/Chest: Effort normal and breath sounds normal. No respiratory distress. She has no wheezes.  Musculoskeletal: Normal range of motion.  Neurological: She is alert and oriented to person, place, and time.  Mild left sided facial weakness - able to close eye completely with effort Able to inflate cheeks but not strongly on left Sensation normal except under left lower lip  Skin: Skin is warm and dry. No rash noted.  Psychiatric: She has a normal mood and affect. Her speech is normal and behavior is normal. Thought content normal.  Nursing note and vitals reviewed.   BP (!) 142/96   Pulse 67   Ht 5' 4.5" (1.638 m)   Wt 152 lb (68.9 kg)   SpO2 99%   BMI 25.69 kg/m   Assessment and Plan: 1. Left-sided Bell's palsy Improving - pt is reassured and educated on the course and expectations Return if sx persist after several more weeks Will give another 5 days of prednisone - predniSONE (DELTASONE) 10 MG tablet; One tab daily  Dispense: 5 tablet; Refill: 0   Meds ordered this encounter  Medications  . predniSONE (DELTASONE) 10 MG tablet    Sig: One tab daily  Dispense:  5 tablet    Refill:  0    Partially dictated using Animal nutritionist. Any errors are unintentional.  Bari Edward, MD [facility]  02/09/2017

## 2017-02-09 NOTE — Telephone Encounter (Signed)
Patient called leaving 2 VMs- Both stating she is calling to follow up about Bells Palsy. States we told her Friday she does not need to be seen unless no better. Patient called today stating she finished medications yesterday and symptoms are not worse but are not going away neither.  States she was on Prednisone 20 mg 4 times daily for 7 days and also Valacyclovir 1 gram 3 times daily for 7 days. Wants to know if she should make OV or can get another round of meds called in?  Please Advise.

## 2017-03-23 ENCOUNTER — Other Ambulatory Visit: Payer: Self-pay | Admitting: Family Medicine

## 2017-03-23 ENCOUNTER — Other Ambulatory Visit: Payer: Self-pay | Admitting: Internal Medicine

## 2017-03-23 DIAGNOSIS — Z1231 Encounter for screening mammogram for malignant neoplasm of breast: Secondary | ICD-10-CM

## 2017-04-25 ENCOUNTER — Other Ambulatory Visit: Payer: Self-pay | Admitting: Family Medicine

## 2017-04-25 ENCOUNTER — Ambulatory Visit
Admission: RE | Admit: 2017-04-25 | Discharge: 2017-04-25 | Disposition: A | Discharge status: Home / Self Care | Payer: 59 | Source: Ambulatory Visit | Attending: Family Medicine | Admitting: Family Medicine

## 2017-04-25 ENCOUNTER — Encounter (INDEPENDENT_AMBULATORY_CARE_PROVIDER_SITE_OTHER): Payer: Self-pay

## 2017-04-25 DIAGNOSIS — Z1231 Encounter for screening mammogram for malignant neoplasm of breast: Secondary | ICD-10-CM | POA: Diagnosis not present

## 2018-03-21 ENCOUNTER — Other Ambulatory Visit: Payer: Self-pay | Admitting: Family Medicine

## 2018-03-21 DIAGNOSIS — Z1231 Encounter for screening mammogram for malignant neoplasm of breast: Secondary | ICD-10-CM

## 2018-04-29 ENCOUNTER — Ambulatory Visit
Admission: RE | Admit: 2018-04-29 | Discharge: 2018-04-29 | Disposition: A | Discharge status: Home / Self Care | Payer: Managed Care, Other (non HMO) | Source: Ambulatory Visit | Attending: Family Medicine | Admitting: Family Medicine

## 2018-04-29 ENCOUNTER — Encounter (INDEPENDENT_AMBULATORY_CARE_PROVIDER_SITE_OTHER): Payer: Self-pay

## 2018-04-29 DIAGNOSIS — Z1231 Encounter for screening mammogram for malignant neoplasm of breast: Secondary | ICD-10-CM | POA: Diagnosis present

## 2019-03-25 ENCOUNTER — Other Ambulatory Visit: Payer: Self-pay | Admitting: Family Medicine

## 2019-03-25 DIAGNOSIS — Z1231 Encounter for screening mammogram for malignant neoplasm of breast: Secondary | ICD-10-CM

## 2019-05-01 ENCOUNTER — Ambulatory Visit
Admission: RE | Admit: 2019-05-01 | Discharge: 2019-05-01 | Disposition: A | Discharge status: Home / Self Care | Payer: Managed Care, Other (non HMO) | Source: Ambulatory Visit | Attending: Family Medicine | Admitting: Family Medicine

## 2019-05-01 ENCOUNTER — Other Ambulatory Visit: Payer: Self-pay

## 2019-05-01 DIAGNOSIS — Z1231 Encounter for screening mammogram for malignant neoplasm of breast: Secondary | ICD-10-CM | POA: Diagnosis not present

## 2020-02-06 ENCOUNTER — Other Ambulatory Visit: Payer: Self-pay

## 2020-02-06 ENCOUNTER — Encounter: Payer: Self-pay | Admitting: Emergency Medicine

## 2020-02-06 ENCOUNTER — Emergency Department
Admission: EM | Admit: 2020-02-06 | Discharge: 2020-02-06 | Disposition: A | Discharge status: Home / Self Care | Payer: Managed Care, Other (non HMO) | Attending: Emergency Medicine | Admitting: Emergency Medicine

## 2020-02-06 DIAGNOSIS — M25561 Pain in right knee: Secondary | ICD-10-CM | POA: Diagnosis not present

## 2020-02-06 DIAGNOSIS — M542 Cervicalgia: Secondary | ICD-10-CM | POA: Insufficient documentation

## 2020-02-06 DIAGNOSIS — M25531 Pain in right wrist: Secondary | ICD-10-CM | POA: Insufficient documentation

## 2020-02-06 DIAGNOSIS — I10 Essential (primary) hypertension: Secondary | ICD-10-CM | POA: Insufficient documentation

## 2020-02-06 DIAGNOSIS — R0781 Pleurodynia: Secondary | ICD-10-CM | POA: Diagnosis not present

## 2020-02-06 DIAGNOSIS — M25562 Pain in left knee: Secondary | ICD-10-CM | POA: Diagnosis not present

## 2020-02-06 DIAGNOSIS — M7918 Myalgia, other site: Secondary | ICD-10-CM | POA: Insufficient documentation

## 2020-02-06 DIAGNOSIS — T148XXA Other injury of unspecified body region, initial encounter: Secondary | ICD-10-CM

## 2020-02-06 DIAGNOSIS — M25532 Pain in left wrist: Secondary | ICD-10-CM | POA: Insufficient documentation

## 2020-02-06 DIAGNOSIS — S161XXA Strain of muscle, fascia and tendon at neck level, initial encounter: Secondary | ICD-10-CM

## 2020-02-06 HISTORY — DX: Essential (primary) hypertension: I10

## 2020-02-06 MED ORDER — KETOROLAC TROMETHAMINE 30 MG/ML IJ SOLN
30.0000 mg | Freq: Once | INTRAMUSCULAR | Status: AC
  Administered 2020-02-06: 30 mg via INTRAMUSCULAR
  Filled 2020-02-06: qty 1

## 2020-02-06 MED ORDER — METHOCARBAMOL 500 MG PO TABS: 500.0000 mg | ORAL_TABLET | Freq: Four times a day (QID) | ORAL | 0 refills | Status: AC

## 2020-02-06 MED ORDER — MELOXICAM 15 MG PO TABS: 15.0000 mg | ORAL_TABLET | Freq: Every day | ORAL | 0 refills | Status: AC
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
## 2020-02-06 NOTE — Discharge Instructions (Signed)
Please take Tylenol every 6 hours as needed for pain.  You may take meloxicam daily for 7 to 10 days with food to help with inflammation.  Use muscle relaxer as needed for muscle tightness and soreness.  Apply ice to sore areas 20 minutes every hour over the next 2 days then you may transition over to heat.  Try to walk and stay active.  Perform light stretching exercises.  Return to the ER for any worsening symptoms or urgent changes in your health.
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
## 2020-03-23 ENCOUNTER — Other Ambulatory Visit: Payer: Self-pay | Admitting: Family Medicine

## 2020-03-23 DIAGNOSIS — Z1231 Encounter for screening mammogram for malignant neoplasm of breast: Secondary | ICD-10-CM

## 2020-05-03 ENCOUNTER — Other Ambulatory Visit: Payer: Self-pay

## 2020-05-03 ENCOUNTER — Ambulatory Visit
Admission: RE | Admit: 2020-05-03 | Discharge: 2020-05-03 | Disposition: A | Discharge status: Home / Self Care | Payer: Managed Care, Other (non HMO) | Source: Ambulatory Visit | Attending: Family Medicine | Admitting: Family Medicine

## 2020-05-03 DIAGNOSIS — Z1231 Encounter for screening mammogram for malignant neoplasm of breast: Secondary | ICD-10-CM | POA: Diagnosis not present

## 2020-05-05 ENCOUNTER — Other Ambulatory Visit: Payer: Self-pay | Admitting: Family Medicine

## 2020-05-26 ENCOUNTER — Other Ambulatory Visit: Payer: Self-pay | Admitting: Family Medicine

## 2020-05-26 DIAGNOSIS — R928 Other abnormal and inconclusive findings on diagnostic imaging of breast: Secondary | ICD-10-CM

## 2020-05-26 DIAGNOSIS — N6320 Unspecified lump in the left breast, unspecified quadrant: Secondary | ICD-10-CM

## 2020-05-27 ENCOUNTER — Other Ambulatory Visit: Payer: Self-pay

## 2020-05-27 ENCOUNTER — Ambulatory Visit
Admission: RE | Admit: 2020-05-27 | Discharge: 2020-05-27 | Disposition: A | Discharge status: Home / Self Care | Payer: Medicare Other | Source: Ambulatory Visit | Attending: Family Medicine | Admitting: Family Medicine

## 2020-05-27 DIAGNOSIS — N6320 Unspecified lump in the left breast, unspecified quadrant: Secondary | ICD-10-CM

## 2020-05-27 DIAGNOSIS — R928 Other abnormal and inconclusive findings on diagnostic imaging of breast: Secondary | ICD-10-CM | POA: Diagnosis not present

## 2021-04-27 ENCOUNTER — Other Ambulatory Visit: Payer: Self-pay | Admitting: Family Medicine

## 2021-04-27 DIAGNOSIS — Z1231 Encounter for screening mammogram for malignant neoplasm of breast: Secondary | ICD-10-CM

## 2021-05-26 ENCOUNTER — Ambulatory Visit
Admission: RE | Admit: 2021-05-26 | Discharge: 2021-05-26 | Disposition: A | Discharge status: Home / Self Care | Payer: Medicare Other | Source: Ambulatory Visit | Attending: Family Medicine | Admitting: Family Medicine

## 2021-05-26 ENCOUNTER — Other Ambulatory Visit: Payer: Self-pay

## 2021-05-26 DIAGNOSIS — Z1231 Encounter for screening mammogram for malignant neoplasm of breast: Secondary | ICD-10-CM | POA: Diagnosis present

## 2022-04-26 ENCOUNTER — Encounter: Payer: Self-pay | Admitting: Family Medicine

## 2022-04-26 DIAGNOSIS — Z1231 Encounter for screening mammogram for malignant neoplasm of breast: Secondary | ICD-10-CM

## 2022-04-28 ENCOUNTER — Other Ambulatory Visit: Payer: Self-pay | Admitting: Family Medicine

## 2022-04-28 DIAGNOSIS — Z1231 Encounter for screening mammogram for malignant neoplasm of breast: Secondary | ICD-10-CM

## 2022-05-29 ENCOUNTER — Ambulatory Visit
Admission: RE | Admit: 2022-05-29 | Discharge: 2022-05-29 | Disposition: A | Discharge status: Home / Self Care | Payer: Medicare Other | Source: Ambulatory Visit | Attending: Family Medicine | Admitting: Family Medicine

## 2022-05-29 DIAGNOSIS — Z1231 Encounter for screening mammogram for malignant neoplasm of breast: Secondary | ICD-10-CM | POA: Diagnosis present

## 2022-06-19 ENCOUNTER — Ambulatory Visit: Payer: Self-pay

## 2022-06-19 ENCOUNTER — Ambulatory Visit (LOCAL_COMMUNITY_HEALTH_CENTER): Payer: Medicare Other

## 2022-06-19 DIAGNOSIS — Z23 Encounter for immunization: Secondary | ICD-10-CM | POA: Diagnosis not present

## 2022-06-19 DIAGNOSIS — Z719 Counseling, unspecified: Secondary | ICD-10-CM

## 2022-06-19 NOTE — Progress Notes (Signed)
  Are you feeling sick today? No   Have you ever received a dose of COVID-19 Vaccine? [employer], [place], [employer], [place], Other) Yes  If yes, which vaccine and how many doses?   4 doses Pfizer   Did you bring the vaccination record card or other documentation?  No   Do you have a health condition or are undergoing treatment that makes you moderately or severely immunocompromised? This would include, but not be limited to: cancer, HIV, organ transplant, immunosuppressive therapy/high-dose corticosteroids, or moderate/severe primary immunodeficiency.  No  Have you received COVID-19 vaccine before or during hematopoietic cell transplant (HCT) or CAR-T-cell therapies? No  Have you ever had an allergic reaction to: (This would include a severe allergic reaction or a reaction that caused hives, swelling, or respiratory distress, including wheezing.) A component of a COVID-19 vaccine or a previous dose of COVID-19 vaccine? No   Have you ever had an allergic reaction to another vaccine (other thanCOVID-19 vaccine) or an injectable medication? (This would include a severe allergic reaction or a reaction that caused hives, swelling, or respiratory distress, including wheezing.)   No    Do you have a history of any of the following:  Myocarditis or Pericarditis No  Dermal fillers:  No  Multisystem Inflammatory Syndrome (MIS-C or MIS-A)? No  COVID-19 disease within the past 3 months? No  Vaccinated with monkeypox vaccine in the last 4 weeks? No   Patient seen in nurse clinic for COVID vaccine.  VIS provided. Comirnaty +12Y/2023-24 given IM right deltoid.  Tolerated well. [facility] updated and copy provided. Waited 15 minutes.

## 2023-04-24 ENCOUNTER — Other Ambulatory Visit: Payer: Self-pay | Admitting: Family Medicine

## 2023-04-24 DIAGNOSIS — Z1231 Encounter for screening mammogram for malignant neoplasm of breast: Secondary | ICD-10-CM

## 2023-05-31 ENCOUNTER — Ambulatory Visit
Admission: RE | Admit: 2023-05-31 | Discharge: 2023-05-31 | Disposition: A | Discharge status: Home / Self Care | Payer: Medicare Other | Source: Ambulatory Visit | Attending: Family Medicine | Admitting: Family Medicine

## 2023-05-31 DIAGNOSIS — Z1231 Encounter for screening mammogram for malignant neoplasm of breast: Secondary | ICD-10-CM | POA: Diagnosis present

## 2023-12-04 IMAGING — MG MM DIGITAL SCREENING BILAT W/ TOMO AND CAD
8 series · 8 of 24 positions shown · non-contrast
Comparison: Previous exam(s).

CLINICAL DATA: Screening.

EXAM:
DIGITAL SCREENING BILATERAL MAMMOGRAM WITH TOMOSYNTHESIS AND CAD
TECHNIQUE: Bilateral screening digital craniocaudal and mediolateral oblique
mammograms were obtained. Bilateral screening digital breast
tomosynthesis was performed. The images were evaluated with
computer-aided detection.

[R CC synth-2D]
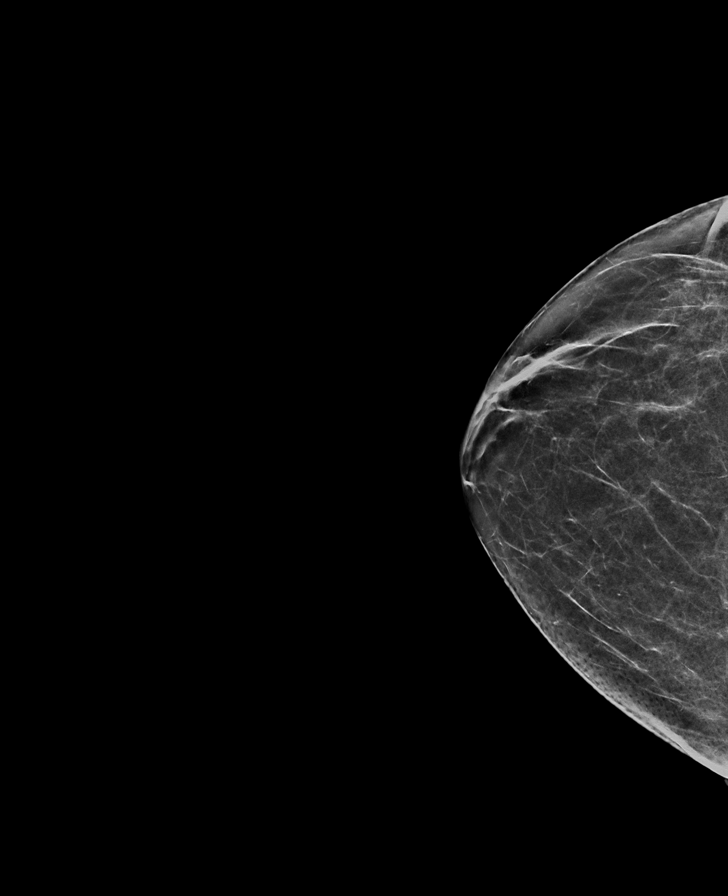

[L MLO synth-2D]
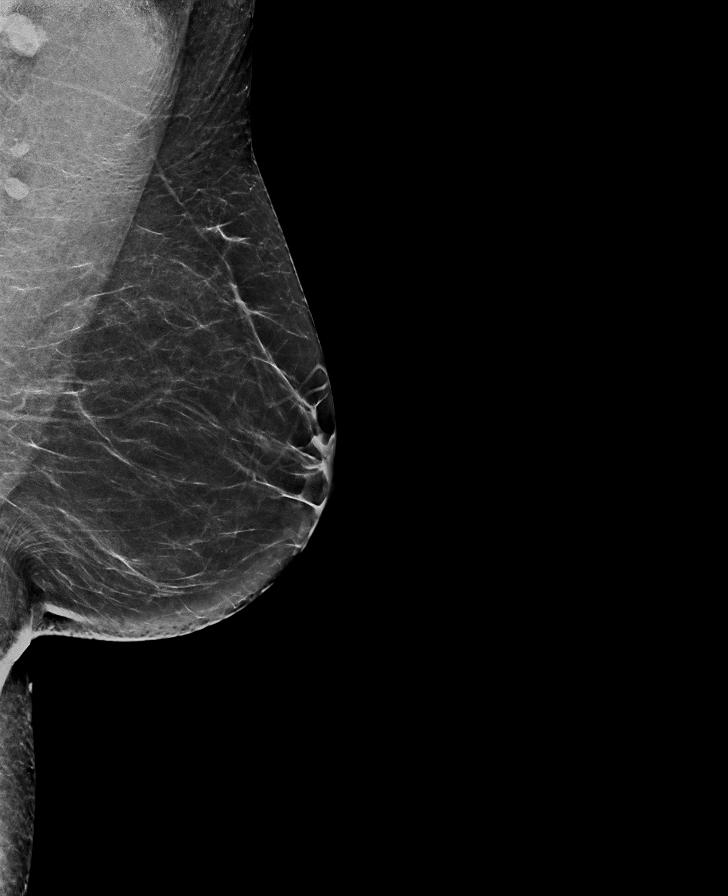

[R MLO synth-2D]
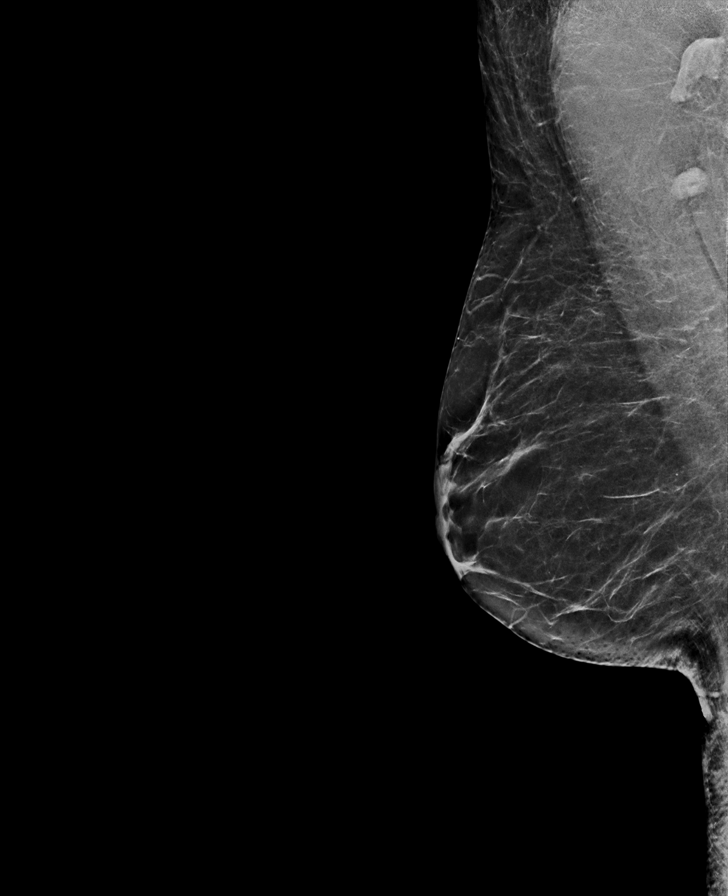

[L CC synth-2D]
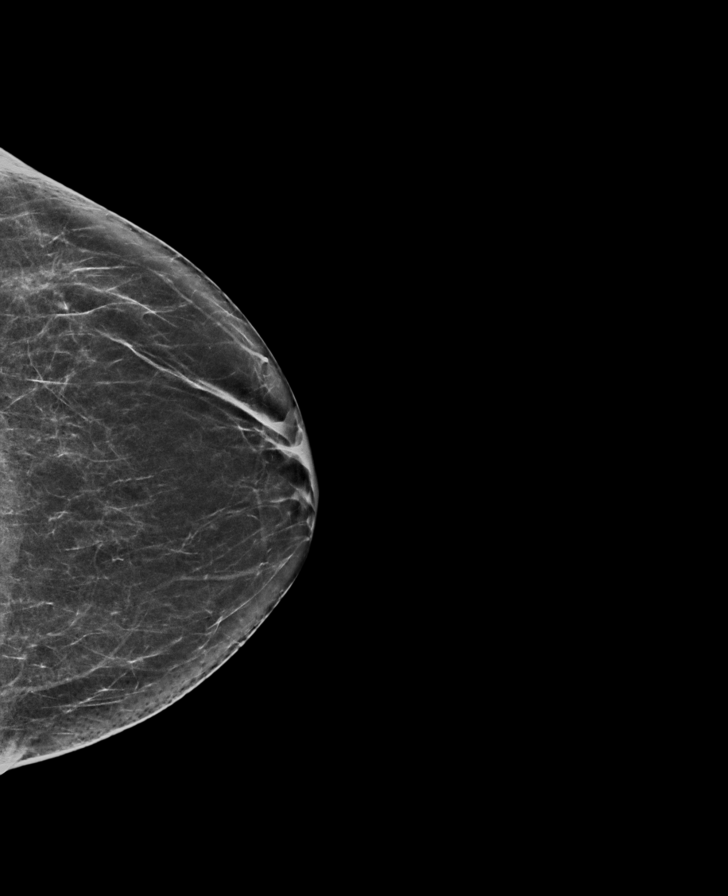

[L MLO tomo · tomo slice 36/71.0]
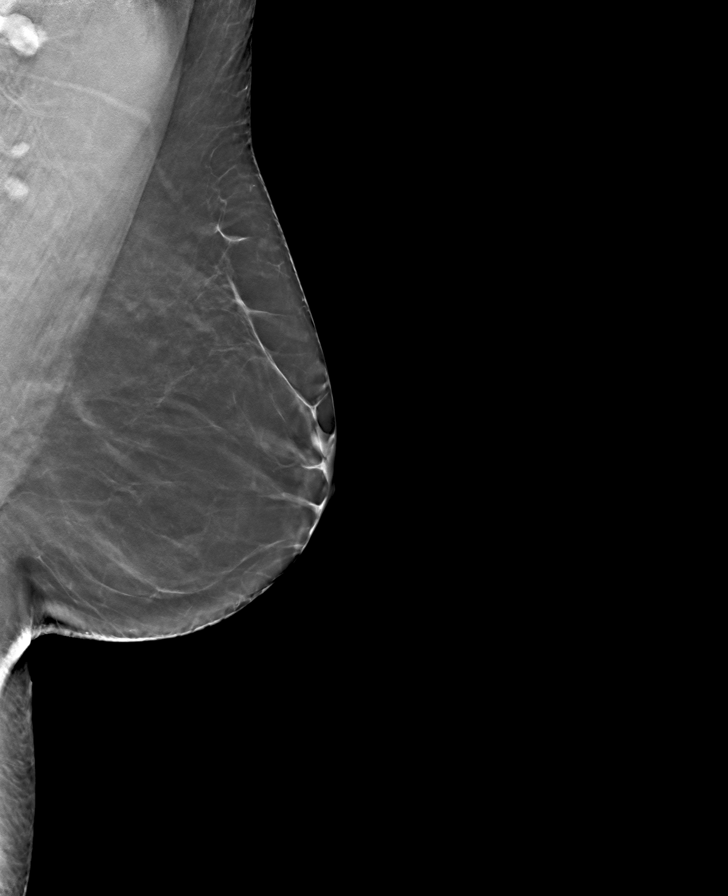

[R MLO tomo · tomo slice 33/66.0]
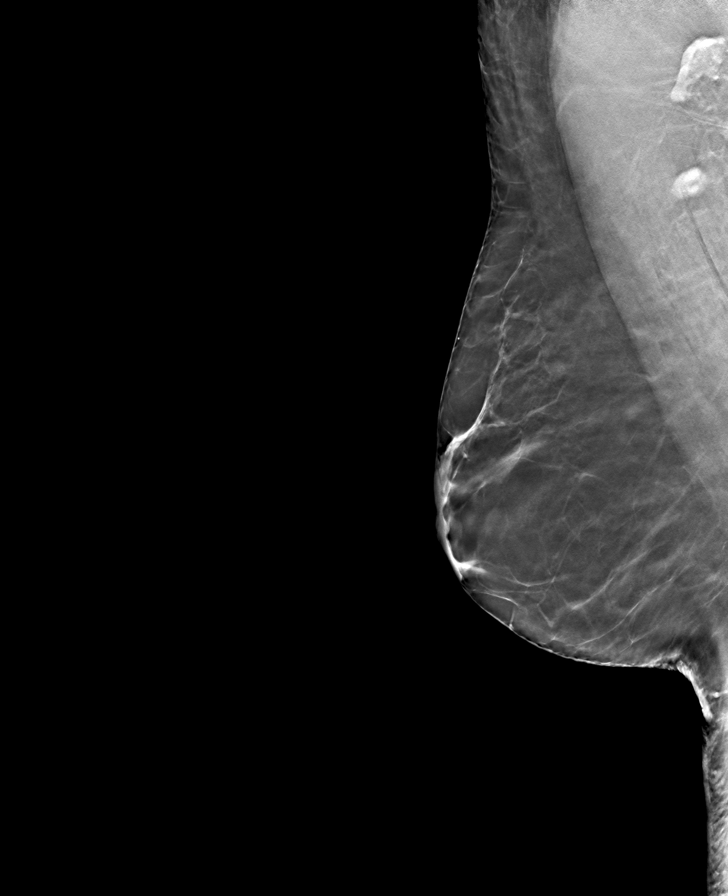

[R CC tomo · tomo slice 30/59.0]
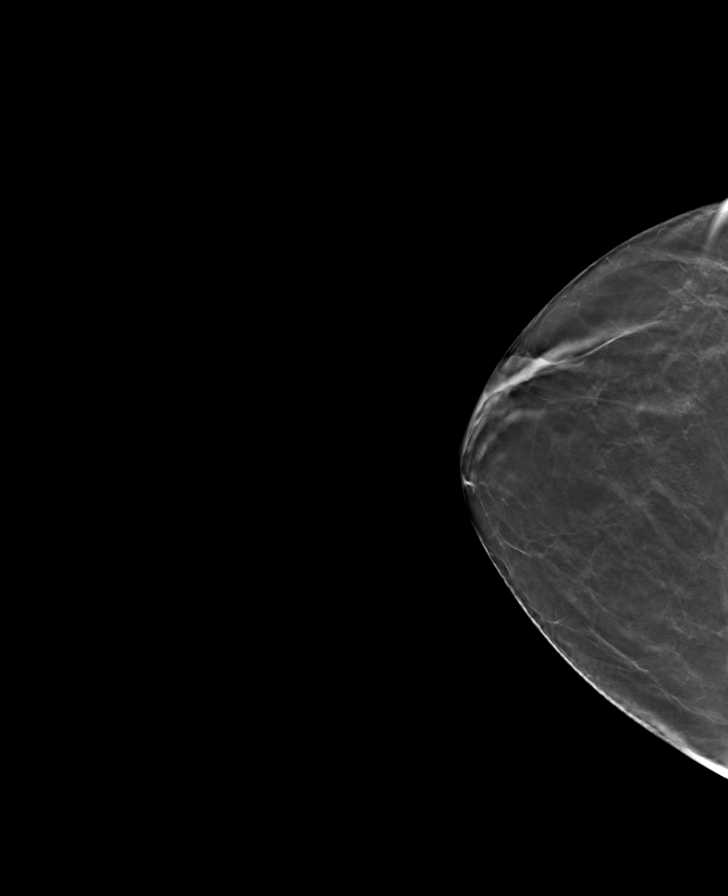

[L CC tomo · tomo slice 31/62.0]
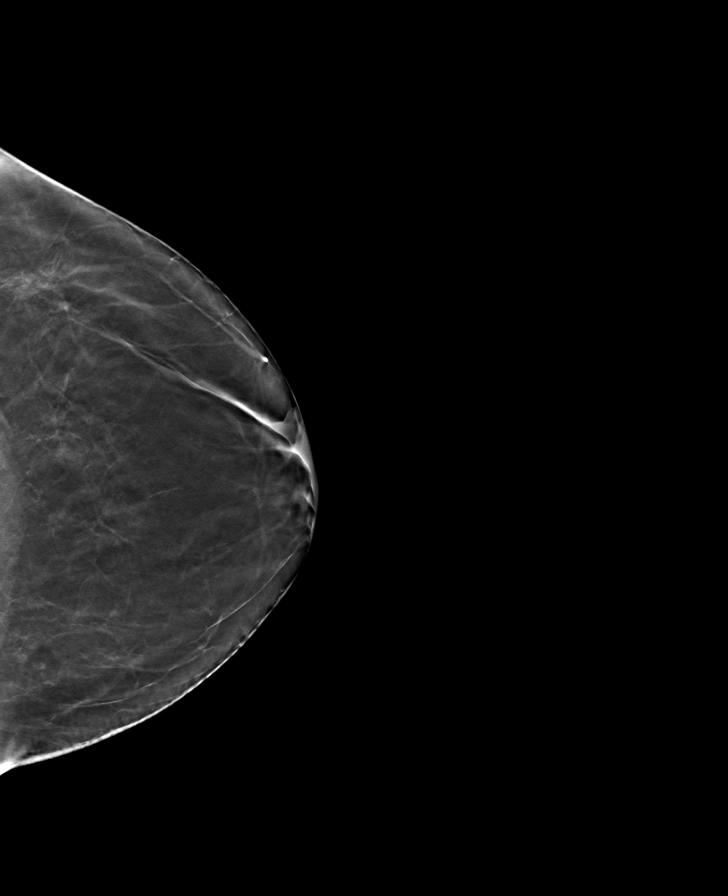

[8 of 24 positions shown; findings below may reference images not displayed]

ACR Breast Density Category b: There are scattered areas of
fibroglandular density.
FINDINGS: There are no findings suspicious for malignancy.
IMPRESSION: No mammographic evidence of malignancy. A result letter of this
screening mammogram will be mailed directly to the patient.

RECOMMENDATION:
Screening mammogram in one year. (Code:51-O-LD2)

BI-RADS CATEGORY  1: Negative.
